# Patient Record
Sex: Male | Born: 1990 | Race: White | Hispanic: No | Marital: Single | Smoking: Former smoker
Health system: Southern US, Community
[De-identification: ages and names within clinical notes are randomized; demographics above are authoritative.]

## PROBLEM LIST (undated history)

## (undated) DIAGNOSIS — T2200XA Burn of unspecified degree of shoulder and upper limb, except wrist and hand, unspecified site, initial encounter: Secondary | ICD-10-CM

## (undated) HISTORY — PX: ARM DEBRIDEMENT: SHX890

## (undated) HISTORY — PX: TYMPANOSTOMY TUBE PLACEMENT: SHX32

## (undated) HISTORY — DX: Burn of unspecified degree of shoulder and upper limb, except wrist and hand, unspecified site, initial encounter: T22.00XA

---
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
## 2009-08-07 ENCOUNTER — Emergency Department (HOSPITAL_COMMUNITY): Admission: EM | Admit: 2009-08-07 | Discharge: 2009-08-08 | Payer: Self-pay | Admitting: Emergency Medicine

## 2010-04-07 LAB — URINALYSIS, ROUTINE W REFLEX MICROSCOPIC
Bilirubin Urine: NEGATIVE
Hgb urine dipstick: NEGATIVE

## 2010-04-07 LAB — BASIC METABOLIC PANEL
BUN: 7 mg/dL (ref 6–23)
CO2: 24 mEq/L (ref 19–32)
Calcium: 9.3 mg/dL (ref 8.4–10.5)
Creatinine, Ser: 1.09 mg/dL (ref 0.4–1.5)
GFR calc Af Amer: >60 mL/min (ref >60)
Glucose, Bld: 123 mg/dL — ABNORMAL HIGH (ref 70–99)

## 2010-04-07 LAB — DIFFERENTIAL
Basophils Absolute: 0 10*3/uL (ref 0.0–0.1)
Eosinophils Absolute: 0 10*3/uL (ref 0.0–0.7)
Eosinophils Relative: 0 % (ref 0–5)
Monocytes Absolute: 0.5 10*3/uL (ref 0.1–1.0)
Monocytes Relative: 4 % (ref 3–12)
Neutrophils Relative %: 92 % — ABNORMAL HIGH (ref 43–77)

## 2010-04-07 LAB — CBC
HCT: 41.6 % (ref 39.0–52.0)
Hemoglobin: 14.5 g/dL (ref 13.0–17.0)
RDW: 12.9 % (ref 11.5–15.5)
WBC: 12.2 10*3/uL — ABNORMAL HIGH (ref 4.0–10.5)

## 2010-04-07 LAB — RAPID STREP SCREEN (MED CTR MEBANE ONLY): Streptococcus, Group A Screen (Direct): NEGATIVE

## 2010-04-07 LAB — RAPID URINE DRUG SCREEN, HOSP PERFORMED: Barbiturates: NOT DETECTED
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
## 2010-09-22 ENCOUNTER — Emergency Department (HOSPITAL_COMMUNITY)
Admission: EM | Admit: 2010-09-22 | Discharge: 2010-09-22 | Disposition: A | Discharge status: Home / Self Care | Payer: 59 | Attending: Emergency Medicine | Admitting: Emergency Medicine

## 2010-09-22 ENCOUNTER — Encounter: Payer: Self-pay | Admitting: *Deleted

## 2010-09-22 DIAGNOSIS — S71009A Unspecified open wound, unspecified hip, initial encounter: Secondary | ICD-10-CM | POA: Insufficient documentation

## 2010-09-22 DIAGNOSIS — S71109A Unspecified open wound, unspecified thigh, initial encounter: Secondary | ICD-10-CM | POA: Insufficient documentation

## 2010-09-22 DIAGNOSIS — W268XXA Contact with other sharp object(s), not elsewhere classified, initial encounter: Secondary | ICD-10-CM | POA: Insufficient documentation

## 2010-09-22 DIAGNOSIS — S71112A Laceration without foreign body, left thigh, initial encounter: Secondary | ICD-10-CM

## 2010-09-22 MED ORDER — TETANUS-DIPHTH-ACELL PERTUSSIS 5-2.5-18.5 LF-MCG/0.5 IM SUSP
0.5000 mL | Freq: Once | INTRAMUSCULAR | Status: AC
  Administered 2010-09-22: 0.5 mL via INTRAMUSCULAR
  Filled 2010-09-22: qty 0.5

## 2010-09-22 MED ORDER — CEPHALEXIN 500 MG PO CAPS: 500.0000 mg | ORAL_CAPSULE | Freq: Three times a day (TID) | ORAL | Status: AC

## 2010-09-22 NOTE — ED Notes (Signed)
Steri strip and gauze dressing applied to wound left thigh. No bleeding at this time.

## 2010-09-22 NOTE — ED Provider Notes (Signed)
History     CSN: [id number] Arrival date & time: 09/22/2010  5:05 PM  No chief complaint on file.  Patient is a 20 y.o. male presenting with skin laceration. The history is provided by the patient.  Laceration  The incident occurred yesterday. The laceration is located on the left leg. The laceration is 2 cm in size. The laceration mechanism was a a clean knife. The pain is at a severity of 2/10. The pain is mild. The pain has been constant since onset. He reports no foreign bodies present. His tetanus status is out of date.    History reviewed. No pertinent past medical history.  History reviewed. No pertinent past surgical history.  History reviewed. No pertinent family history.  History  Substance Use Topics  . Smoking status: Never Smoker   . Smokeless tobacco: Not on file  . Alcohol Use: No      Review of Systems  Musculoskeletal: Negative for myalgias, joint swelling, arthralgias and gait problem.  Skin: Positive for wound. Negative for color change.  Neurological: Negative for weakness and numbness.  Hematological: Negative for adenopathy. Does not bruise/bleed easily.  All other systems reviewed and are negative.    Physical Exam  BP 136/83  Pulse 67  Temp(Src) 99 F (37.2 C) (Oral)  Resp 16  Ht 5\' 10"  (1.778 m)  Wt 125 lb (56.7 kg)  BMI 17.94 kg/m2  SpO2 100%  Physical Exam  Nursing note and vitals reviewed. Constitutional: He is oriented to person, place, and time. He appears well-developed and well-nourished. No distress.  HENT:  Head: Normocephalic and atraumatic.  Mouth/Throat: Oropharynx is clear and moist.  Cardiovascular: Normal rate, regular rhythm and normal heart sounds.   Pulmonary/Chest: Effort normal and breath sounds normal.  Musculoskeletal: He exhibits tenderness. He exhibits no edema.  Neurological: He is alert and oriented to person, place, and time. No cranial nerve deficit. He exhibits normal muscle tone. Coordination normal.  Skin:  Skin is warm and dry. Laceration noted. No bruising, no ecchymosis and no rash noted. No erythema.       2 cm laceration to upper left thigh.  Bleeding controled.  Wound appears superificial, sensation intact.      ED Course  Procedures  MDM  2 cm superficial lac to the anterior left thigh.  Bleeding controlled.  Pt has full ROM of the left leg.  Sensation intact.  Ambulates well.  Lac is approx 24 hrs old and therefore too old for primary closure.  Wound was approximately loosely using one steri-strip.  I will start pt on abx therapy . I have advised pt of signs of wound infection and he agrees to return to ER if needed,  Wound was also cleaned in the ED.     Patient / Family / Caregiver understand and agree with initial ED impression and plan with expectations set for ED visit.    5:56 PM Wound(s) explored with adequate hemostasis through ROM, no apparent gross foreign body retained, no significant involvement of deep structures such as bone / joint / tendon / or neurovascular involvement noted.  Baseline Strength and Sensation to affected extremity(ies) with normal light touch for Pt, distal NVI with CR< 2 secs and pulse(s) intact to affected extremity(ies).        Tammy L. [place], [place] 09/22/10 1801
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
## 2010-09-23 NOTE — ED Provider Notes (Signed)
Medical screening examination/treatment/procedure(s) were performed by non-physician practitioner and as supervising physician I was immediately available for consultation/collaboration.   Vida Roller, MD 09/23/10 [phone]

## 2012-10-26 ENCOUNTER — Encounter: Payer: Self-pay | Admitting: Podiatry

## 2012-10-27 ENCOUNTER — Encounter: Payer: Self-pay | Admitting: Podiatry

## 2012-10-27 ENCOUNTER — Ambulatory Visit (INDEPENDENT_AMBULATORY_CARE_PROVIDER_SITE_OTHER): Payer: 59 | Admitting: Podiatry

## 2012-10-27 VITALS — BP 118/67 | HR 85 | Temp 99.3°F | Resp 16 | Ht 72.0 in | Wt 133.2 lb

## 2012-10-27 DIAGNOSIS — B079 Viral wart, unspecified: Secondary | ICD-10-CM

## 2012-10-27 MED ORDER — HYDROCODONE-ACETAMINOPHEN 10-325 MG PO TABS: 1.0000 | ORAL_TABLET | Freq: Three times a day (TID) | ORAL | Status: AC | PRN

## 2012-10-27 NOTE — Patient Instructions (Signed)

## 2012-10-27 NOTE — Progress Notes (Signed)
N HURT  L LEFT HEEL D 1 YEAR O SLOWLY C WORSE A WALKING T PT'S MOM TRIED DIGGING IT OUT, ALCOHOL, PEROXIDE

## 2012-10-27 NOTE — Progress Notes (Signed)
Subjective:     Patient ID: CAROLYN MANISCALCO, male   DOB: April 23, 1990, 23 y.o.   MRN: [id number]  Foot Pain   patient presents with mother stating that had this spot on the bottom of my heel for about 18 months which is becoming increasingly tender. States he has not treated this at all.   Review of Systems  All other systems reviewed and are negative.       Objective:   Physical Exam  Nursing note and vitals reviewed. Cardiovascular: Intact distal pulses.    Neurological intact bilateral. Muscle strength found to be adequate of all muscle groups into the foot. Range of motion subtalar midtarsal joints within normal limits. Patient is well oriented x3. Normal hair growth. Noted there to be a mass measuring approximately 8 x 8 mm plantar aspect left heel. Upon debridable pinpoint bleeding is noted and it is painful to lateral pressure    Assessment:     Verruca plantaris plantar left heel.    Plan:     Initial H&P performed. Discussed treatment options with the family including topical versus surgical excision. A prefer to have this removed surgically. I infiltrated the area with 60 mg Xylocaine with epinephrine. I then circumscribed the mass and removed the mass in toto. I took it to be dermal epidermal junction and then applied phenol to the base to remove any remaining viral cells. Took a sample and placed it in formalin for pathological evaluation. Applied sterile dressing and advised on limited walking for several days. Prescribed Vicodin 10/325 #20 for postoperative pain.

## 2012-11-12 ENCOUNTER — Encounter: Payer: 59 | Admitting: Podiatry

## 2015-11-19 ENCOUNTER — Ambulatory Visit
Admission: EM | Admit: 2015-11-19 | Discharge: 2015-11-19 | Disposition: A | Discharge status: Home / Self Care | Payer: 59 | Attending: Family Medicine | Admitting: Family Medicine

## 2015-11-19 ENCOUNTER — Ambulatory Visit (INDEPENDENT_AMBULATORY_CARE_PROVIDER_SITE_OTHER): Payer: 59

## 2015-11-19 DIAGNOSIS — R059 Cough, unspecified: Secondary | ICD-10-CM

## 2015-11-19 DIAGNOSIS — J069 Acute upper respiratory infection, unspecified: Secondary | ICD-10-CM | POA: Diagnosis not present

## 2015-11-19 DIAGNOSIS — R058 Other specified cough: Secondary | ICD-10-CM

## 2015-11-19 DIAGNOSIS — R05 Cough: Secondary | ICD-10-CM

## 2015-11-19 MED ORDER — PREDNISONE 10 MG (21) PO TBPK: ORAL_TABLET | ORAL | 0 refills | Status: DC

## 2015-11-19 MED ORDER — HYDROCOD POLST-CPM POLST ER 10-8 MG/5ML PO SUER: 5.0000 mL | Freq: Two times a day (BID) | ORAL | 0 refills | Status: AC | PRN

## 2015-11-19 MED ORDER — AZITHROMYCIN 250 MG PO TABS: ORAL_TABLET | ORAL | 0 refills | Status: AC
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
## 2015-11-19 NOTE — ED Triage Notes (Signed)
Patient complains of cough and congestion for 6 weeks. Patient states that he can barely talk through coughing spells. Patient states that he has been trying sudafed with no relief.

## 2015-11-22 ENCOUNTER — Encounter (HOSPITAL_COMMUNITY): Payer: Self-pay

## 2015-11-22 ENCOUNTER — Emergency Department (HOSPITAL_COMMUNITY)
Admission: EM | Admit: 2015-11-22 | Discharge: 2015-11-22 | Disposition: A | Discharge status: Home / Self Care | Payer: 59 | Attending: Emergency Medicine | Admitting: Emergency Medicine

## 2015-11-22 ENCOUNTER — Emergency Department (HOSPITAL_COMMUNITY): Payer: 59

## 2015-11-22 DIAGNOSIS — R05 Cough: Secondary | ICD-10-CM | POA: Insufficient documentation

## 2015-11-22 DIAGNOSIS — R42 Dizziness and giddiness: Secondary | ICD-10-CM | POA: Diagnosis not present

## 2015-11-22 DIAGNOSIS — R079 Chest pain, unspecified: Secondary | ICD-10-CM | POA: Insufficient documentation

## 2015-11-22 DIAGNOSIS — Z792 Long term (current) use of antibiotics: Secondary | ICD-10-CM | POA: Insufficient documentation

## 2015-11-22 DIAGNOSIS — Z79899 Other long term (current) drug therapy: Secondary | ICD-10-CM | POA: Insufficient documentation

## 2015-11-22 DIAGNOSIS — F17220 Nicotine dependence, chewing tobacco, uncomplicated: Secondary | ICD-10-CM | POA: Insufficient documentation

## 2015-11-22 LAB — CBC
HEMATOCRIT: 47 % (ref 39.0–52.0)
HEMOGLOBIN: 16.7 g/dL (ref 13.0–17.0)
MCH: 32.2 pg (ref 26.0–34.0)
MCHC: 35.5 g/dL (ref 30.0–36.0)
MCV: 90.7 fL (ref 78.0–100.0)
Platelets: 323 10*3/uL (ref 150–400)
RBC: 5.18 MIL/uL (ref 4.22–5.81)
RDW: 13 % (ref 11.5–15.5)
WBC: 13.3 10*3/uL — AB (ref 4.0–10.5)

## 2015-11-22 LAB — BASIC METABOLIC PANEL
ANION GAP: 7 (ref 5–15)
BUN: 13 mg/dL (ref 6–20)
CHLORIDE: 101 mmol/L (ref 101–111)
CO2: 28 mmol/L (ref 22–32)
Calcium: 9.5 mg/dL (ref 8.9–10.3)
Creatinine, Ser: 0.83 mg/dL (ref 0.61–1.24)
GFR calc Af Amer: >60 mL/min (ref >60)
Glucose, Bld: 129 mg/dL — ABNORMAL HIGH (ref 65–99)
POTASSIUM: 3.2 mmol/L — AB (ref 3.5–5.1)
SODIUM: 136 mmol/L (ref 135–145)

## 2015-11-22 LAB — BORDETELLA PERTUSSIS PCR
B PARAPERTUSSIS, DNA: NEGATIVE
B pertussis, DNA: NEGATIVE

## 2015-11-22 LAB — TROPONIN I: Troponin I: <0.03 ng/mL (ref <0.03)

## 2015-11-22 NOTE — Discharge Instructions (Signed)
EKG was normal. Recommend several blood pressure readings as we discussed before making any determination about hypertension.

## 2015-11-22 NOTE — ED Triage Notes (Signed)
Patient reports getting ready this morning and had episode of chest pain/dizziness. States he checked his blood pressure and was high. Denies chest pain at this time.

## 2015-11-22 NOTE — ED Notes (Signed)
Patient states "I need to leave so I can pick up my child from school."
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
## 2015-11-23 ENCOUNTER — Telehealth: Payer: Self-pay | Admitting: *Deleted

## 2015-11-23 NOTE — Telephone Encounter (Signed)
Patient returned earlier phone message.  DOB verified, communicated negative pertussis test result. Patient reported feeling better and confirmed understanding of test result.

## 2018-04-13 IMAGING — DX DG CHEST 2V
2 series · 2 of 2 positions shown · non-contrast
Comparison: Radiographs November 19, 2015

CLINICAL DATA: Chest pain.

EXAM:
CHEST  2 VIEW

[chest pa]
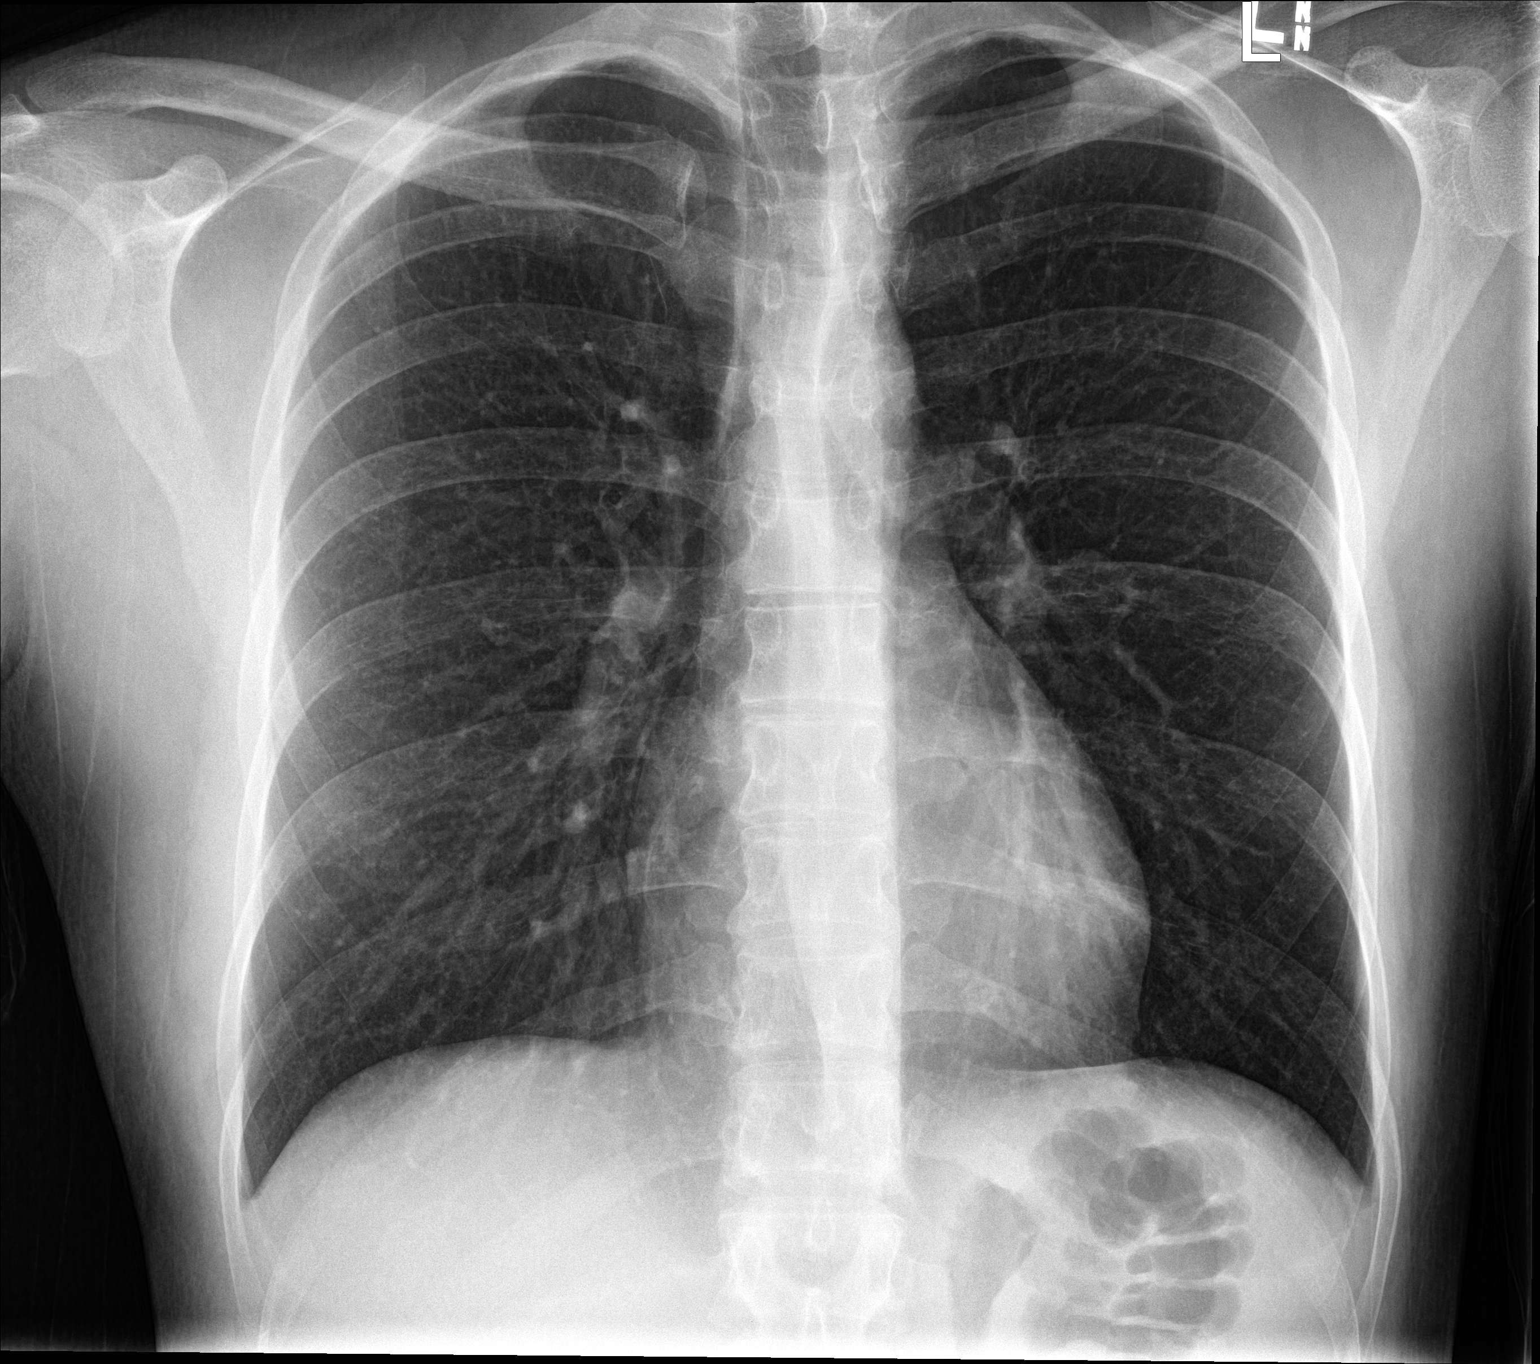

[chest lat]
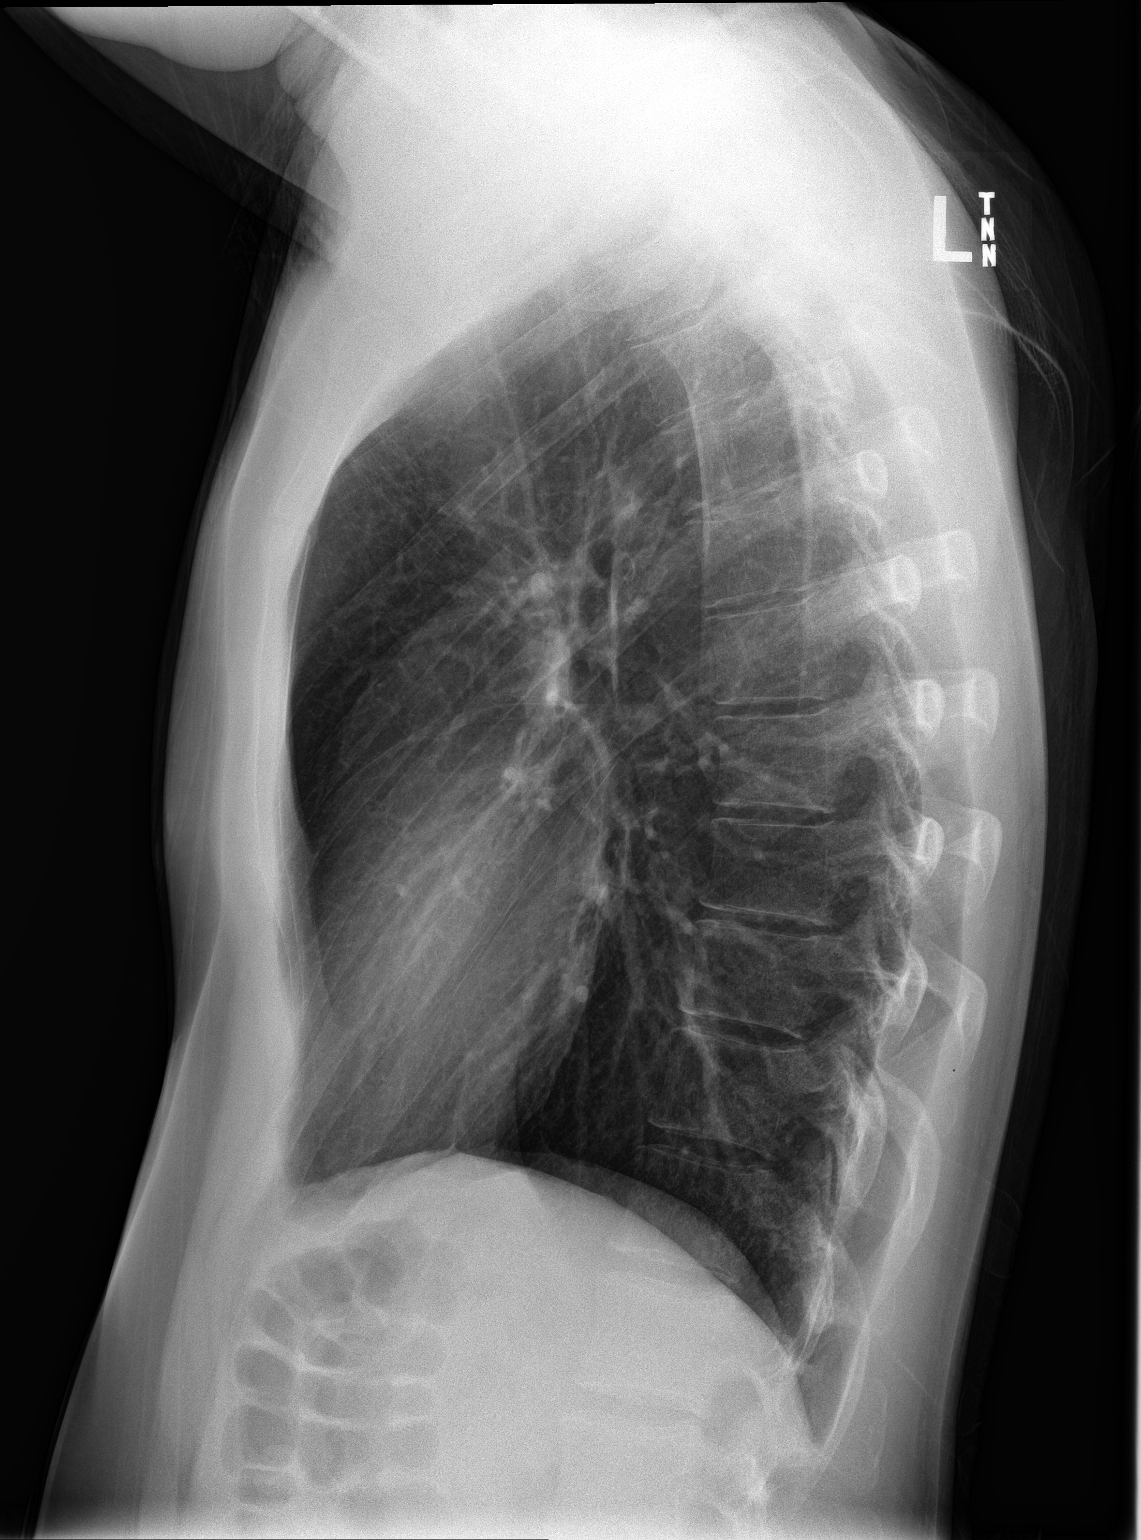

[2 of 2 positions shown; findings below may reference images not displayed]

FINDINGS: The heart size and mediastinal contours are within normal limits.
Both lungs are clear. No pneumothorax or pleural effusion is noted.
The visualized skeletal structures are unremarkable.
IMPRESSION: No active cardiopulmonary disease.

## 2019-06-14 ENCOUNTER — Emergency Department (HOSPITAL_COMMUNITY)
Admission: EM | Admit: 2019-06-14 | Discharge: 2019-06-14 | Disposition: A | Discharge status: Home / Self Care | Payer: BC Managed Care – PPO | Attending: Emergency Medicine | Admitting: Emergency Medicine

## 2019-06-14 ENCOUNTER — Other Ambulatory Visit: Payer: Self-pay

## 2019-06-14 ENCOUNTER — Encounter (HOSPITAL_COMMUNITY): Payer: Self-pay | Admitting: Emergency Medicine

## 2019-06-14 DIAGNOSIS — Z87891 Personal history of nicotine dependence: Secondary | ICD-10-CM | POA: Insufficient documentation

## 2019-06-14 DIAGNOSIS — L259 Unspecified contact dermatitis, unspecified cause: Secondary | ICD-10-CM | POA: Insufficient documentation

## 2019-06-14 DIAGNOSIS — L299 Pruritus, unspecified: Secondary | ICD-10-CM | POA: Diagnosis present

## 2019-06-14 DIAGNOSIS — Z79899 Other long term (current) drug therapy: Secondary | ICD-10-CM | POA: Insufficient documentation

## 2019-06-14 MED ORDER — PREDNISONE 20 MG PO TABS: ORAL_TABLET | ORAL | 0 refills | Status: AC

## 2019-06-14 MED ORDER — FAMOTIDINE 20 MG PO TABS
20.0000 mg | ORAL_TABLET | Freq: Once | ORAL | Status: AC
  Administered 2019-06-14: 20 mg via ORAL
  Filled 2019-06-14: qty 1

## 2019-06-14 MED ORDER — DEXAMETHASONE SODIUM PHOSPHATE 10 MG/ML IJ SOLN
10.0000 mg | Freq: Once | INTRAMUSCULAR | Status: AC
  Administered 2019-06-14: 10 mg via INTRAMUSCULAR
  Filled 2019-06-14: qty 1

## 2019-06-14 MED ORDER — FAMOTIDINE 20 MG PO TABS: 20.0000 mg | ORAL_TABLET | Freq: Two times a day (BID) | ORAL | 0 refills | Status: AC
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
## 2019-06-14 NOTE — Discharge Instructions (Addendum)
Stay cool, heat will make the itching and rash worse.  Take the medications as prescribed.  You can take Claritin 1 tablet over-the-counter daily which will also help with your rash.  If you are having itching that is keeping you awake or that is uncontrolled with the other medications, you can take Benadryl 25 to 50 mg every 6 hours as needed.  Just realize however the Benadryl can make you sleepy so do not drive a vehicle or drive tractors or do any dangerous activity while taking the Benadryl.  Calamine lotion or bathing in oatmeal baths will soothe your skin.  Recheck if the rash is lasting more than a week or if you feel like it is in your throat or you are having difficulty swallowing or breathing.
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
## 2020-02-25 ENCOUNTER — Ambulatory Visit: Payer: Self-pay

## 2023-10-22 ENCOUNTER — Other Ambulatory Visit: Payer: Self-pay | Admitting: Internal Medicine

## 2023-10-22 DIAGNOSIS — R7989 Other specified abnormal findings of blood chemistry: Secondary | ICD-10-CM
